# Patient Record
Sex: Female | Born: 1980 | Hispanic: No | Marital: Married | Smoking: Current every day smoker
Health system: Southern US, Community
[De-identification: ages and names within clinical notes are randomized; demographics above are authoritative.]

---

## 2010-11-24 ENCOUNTER — Emergency Department (HOSPITAL_COMMUNITY)
Admission: EM | Admit: 2010-11-24 | Discharge: 2010-11-24 | Disposition: A | Discharge status: Home / Self Care | Payer: Medicaid Other | Attending: Emergency Medicine | Admitting: Emergency Medicine

## 2010-11-24 DIAGNOSIS — R030 Elevated blood-pressure reading, without diagnosis of hypertension: Secondary | ICD-10-CM | POA: Insufficient documentation

## 2010-11-24 DIAGNOSIS — L089 Local infection of the skin and subcutaneous tissue, unspecified: Secondary | ICD-10-CM | POA: Insufficient documentation

## 2013-12-05 ENCOUNTER — Encounter: Payer: Medicaid Other | Admitting: Family

## 2017-11-01 ENCOUNTER — Other Ambulatory Visit: Payer: Self-pay

## 2017-11-01 ENCOUNTER — Encounter (HOSPITAL_COMMUNITY): Payer: Self-pay | Admitting: Emergency Medicine

## 2017-11-01 ENCOUNTER — Emergency Department (HOSPITAL_COMMUNITY)
Admission: EM | Admit: 2017-11-01 | Discharge: 2017-11-02 | Disposition: A | Discharge status: Home / Self Care | Payer: Self-pay | Attending: Emergency Medicine | Admitting: Emergency Medicine

## 2017-11-01 ENCOUNTER — Emergency Department (HOSPITAL_COMMUNITY): Payer: Self-pay

## 2017-11-01 DIAGNOSIS — Y9301 Activity, walking, marching and hiking: Secondary | ICD-10-CM | POA: Insufficient documentation

## 2017-11-01 DIAGNOSIS — X58XXXA Exposure to other specified factors, initial encounter: Secondary | ICD-10-CM | POA: Insufficient documentation

## 2017-11-01 DIAGNOSIS — Y999 Unspecified external cause status: Secondary | ICD-10-CM | POA: Insufficient documentation

## 2017-11-01 DIAGNOSIS — Y929 Unspecified place or not applicable: Secondary | ICD-10-CM | POA: Insufficient documentation

## 2017-11-01 DIAGNOSIS — F17200 Nicotine dependence, unspecified, uncomplicated: Secondary | ICD-10-CM | POA: Insufficient documentation

## 2017-11-01 DIAGNOSIS — M25531 Pain in right wrist: Secondary | ICD-10-CM | POA: Insufficient documentation

## 2017-11-01 NOTE — ED Triage Notes (Addendum)
Pt reports feeling a pain in her hand about 1.5 weeks ago when pushing a cart, reports she heard a "pop". Pt reports having pain when lifting or putting pressure on her hand. She denies recent injury.

## 2017-11-01 NOTE — ED Notes (Signed)
Patient transported to X-ray 

## 2017-11-01 NOTE — ED Provider Notes (Signed)
[facility]   CSN: [id number] Arrival date & time: 11/01/17  2242     History   Chief Complaint Chief Complaint  Patient presents with  . Hand Pain    HPI Aimee Schmidt is a 37 y.o. female with a hx of tobacco abuse who presents to the ED with complaints of R hand pain s/p possible injury 1.5 weeks ago. Patient states that she was at work and went to push a cart with the palm of her hand with her wrist in extension and she felt a popping sensation to the radial aspect of the wrist/hand. States she has had discomfort since this incident, mostly with movements such as extension and with applying pressure to the palm of the hand, if at rest there is not much pain. Tonight she lifted a child and felt the pain to the proximal portion of the pain radiating to her mid forearm on that side prompting ER visit. Denies numbness, weakness, tingling, fever, or chills. Patient is R hand dominant. Patient denies chance of pregnancy.   HPI  History reviewed. No pertinent past medical history.  There are no active problems to display for this patient.   Past Surgical History:  Procedure Laterality Date  . CESAREAN SECTION       OB History   None      Home Medications    Prior to Admission medications   Not on File    Family History History reviewed. No pertinent family history.  Social History Social History   Tobacco Use  . Smoking status: Current Every Day Smoker    Packs/day: 0.20  . Smokeless tobacco: Never Used  Substance Use Topics  . Alcohol use: Not on file    Comment: occasionally  . Drug use: Not on file     Allergies   Latex   Review of Systems Review of Systems  Constitutional: Negative for chills and fever.  Musculoskeletal: Positive for arthralgias.  Skin: Negative for color change and wound.  Neurological: Negative for weakness and numbness.   Physical Exam Updated Vital Signs BP 111/72   Pulse 94    Temp 98.7 F (37.1 C) (Oral)   Resp 17   Ht 5\' 5"  (1.651 m)   Wt 83.9 kg   LMP 10/11/2017   SpO2 100%   BMI 30.79 kg/m   Physical Exam  Constitutional: She appears well-developed and well-nourished. No distress.  HENT:  Head: Normocephalic and atraumatic.  Eyes: Conjunctivae are normal. Right eye exhibits no discharge. Left eye exhibits no discharge.  Cardiovascular:  Pulses:      Radial pulses are 2+ on the right side, and 2+ on the left side.  Musculoskeletal:  Upper extremity: No obvious deformity, appreciable swelling, erythema, ecchymosis, warmth, or open wounds. Patient has full AROM to bilateral elbows, wrists, and all digits including IP and MCP joints. She is tender to palpation over the R 1st MCP, 1st metatarsal and radial aspect of the distal forearm/wrist. There is no snuffbox tenderness. She is NVI distally. Negative phalens. Negative median nerve or ulnar nerve tinels. + Finkelstein to RUE.   Neurological: She is alert.  Clear speech. 5/5 symmetric grip strength. Sensation grossly intact to bilateral upper extremities. Able to perform OK sign, thumbs up, and cross 2nd/3rd digits bilaterally.   Skin: Capillary refill takes less than 2 seconds.  Psychiatric: She has a normal mood and affect. Her behavior is normal. Thought content normal.  Nursing note and vitals  reviewed.   ED Treatments / Results  Labs (all labs ordered are listed, but only abnormal results are displayed) Labs Reviewed - No data to display  EKG None  Radiology No results found.  Procedures Procedures (including critical care time) .Splint Application Date/Time: 11/02/2017 12:45AM  Performed by: EDT Authorized by: Cherly Anderson, PA-C    Consent:    Consent obtained:  Verbal   Consent given by:  Patient   Risks discussed:  Discoloration, numbness, pain and swelling   Alternatives discussed:  No treatment Pre-procedure details:    Sensation:  Normal Procedure details:     Laterality:  Right   Location:  Wrist   Wrist:  R wrist   Splint type:  Thumb spica (removal brace) Post-procedure details:    Sensation:  Normal   Patient tolerance of procedure:  Tolerated well, no immediate complications  Medications Ordered in ED Medications  naproxen (NAPROSYN) tablet 500 mg (500 mg Oral Given 11/02/17 0108)     Initial Impression / Assessment and Plan / ED Course  I have reviewed the triage vital signs and the nursing notes.  Pertinent labs & imaging results that were available during my care of the patient were reviewed by me and considered in my medical decision making (see chart for details).   Patient presents to the ED with R hand pain. Patient nontoxic appearing, resting comfortably. No fevers or overlying erythema/warmth to raise concern for infectious etiology.  X-ray negative for fracture/dislocation. NVI distally. Based on H&P query DeQuervain's tenosynovitis, will place in thumb spica with prescription for naproxen and hand/PCP follow up. I discussed results, treatment plan, need for follow-up, and return precautions with the patient. Provided opportunity for questions, patient confirmed understanding and is in agreement with plan.    Final Clinical Impressions(s) / ED Diagnoses   Final diagnoses:  Right wrist pain    ED Discharge Orders         Ordered    naproxen (NAPROSYN) 500 MG tablet  2 times daily     11/02/17 0100           Petrucelli, Pleas Koch, PA-C 11/02/17 0119    Azalia Bilis, MD 11/02/17 [phone]

## 2017-11-02 ENCOUNTER — Encounter (HOSPITAL_COMMUNITY): Payer: Self-pay | Admitting: Student

## 2017-11-02 MED ORDER — NAPROXEN 250 MG PO TABS
500.0000 mg | ORAL_TABLET | Freq: Once | ORAL | Status: AC
  Administered 2017-11-02: 500 mg via ORAL
  Filled 2017-11-02: qty 2

## 2017-11-02 MED ORDER — NAPROXEN 500 MG PO TABS: 500.0000 mg | ORAL_TABLET | Freq: Two times a day (BID) | ORAL | 0 refills | Status: AC

## 2017-11-02 NOTE — ED Notes (Signed)
Pt placed in right wrist splint, pulse, function, and feeling maintained after placement.

## 2017-11-02 NOTE — Discharge Instructions (Addendum)
Please read and follow all provided instructions.  You have been seen today for pain to your right hand/wrist.   Tests performed today include: An x-ray of the affected area - does NOT show any broken bones or dislocations.  Vital signs. See below for your results today.   We suspect your pain may be due to a problem with the tendon in your wrist/hand. We are providing a brace to wear for comfort and stabilization.   Home care instructions:  Protect (with brace, splint, sling), if given by your provider Rest  Medications:  - Naproxen is a nonsteroidal anti-inflammatory medication that will help with pain and swelling. Be sure to take this medication as prescribed with food, 1 pill every 12 hours,  It should be taken with food, as it can cause stomach upset, and more seriously, stomach bleeding. Do not take other nonsteroidal anti-inflammatory medications with this such as Advil, Motrin, Aleve, Mobic, Goodie Powder, or Motrin.    You make take Tylenol per over the counter dosing with these medications.   We have prescribed you new medication(s) today. Discuss the medications prescribed today with your pharmacist as they can have adverse effects and interactions with your other medicines including over the counter and prescribed medications. Seek medical evaluation if you start to experience new or abnormal symptoms after taking one of these medicines, seek care immediately if you start to experience difficulty breathing, feeling of your throat closing, facial swelling, or rash as these could be indications of a more serious allergic reaction  Follow-up instructions: Please follow-up with your primary care provider or the provided orthopedic physician (bone specialist) if you continue to have significant pain in 1 week. In this case you may have a more severe injury that requires further care.   Return instructions:  Please return if your digits or extremity are numb or tingling, appear gray  or blue, or you have severe pain (also elevate the extremity and loosen splint or wrap if you were given one) Please return if you have redness or fevers.  Please return to the Emergency Department if you experience worsening symptoms.  Please return if you have any other emergent concerns. Additional Information:  Your vital signs today were: BP 111/72    Pulse 94    Temp 98.7 F (37.1 C) (Oral)    Resp 17    Ht 5\' 5"  (1.651 m)    Wt 83.9 kg    LMP 10/11/2017    SpO2 100%    BMI 30.79 kg/m  If your blood pressure (BP) was elevated above 135/85 this visit, please have this repeated by your doctor within one month. ---------------

## 2019-09-15 IMAGING — DX DG HAND COMPLETE 3+V*R*
3 series · 3 of 3 positions shown · non-contrast
Comparison: None.

CLINICAL DATA: Pain in the hand for 1.5 weeks while pushing a car.

EXAM:
RIGHT HAND - COMPLETE 3+ VIEW

[hand pa]
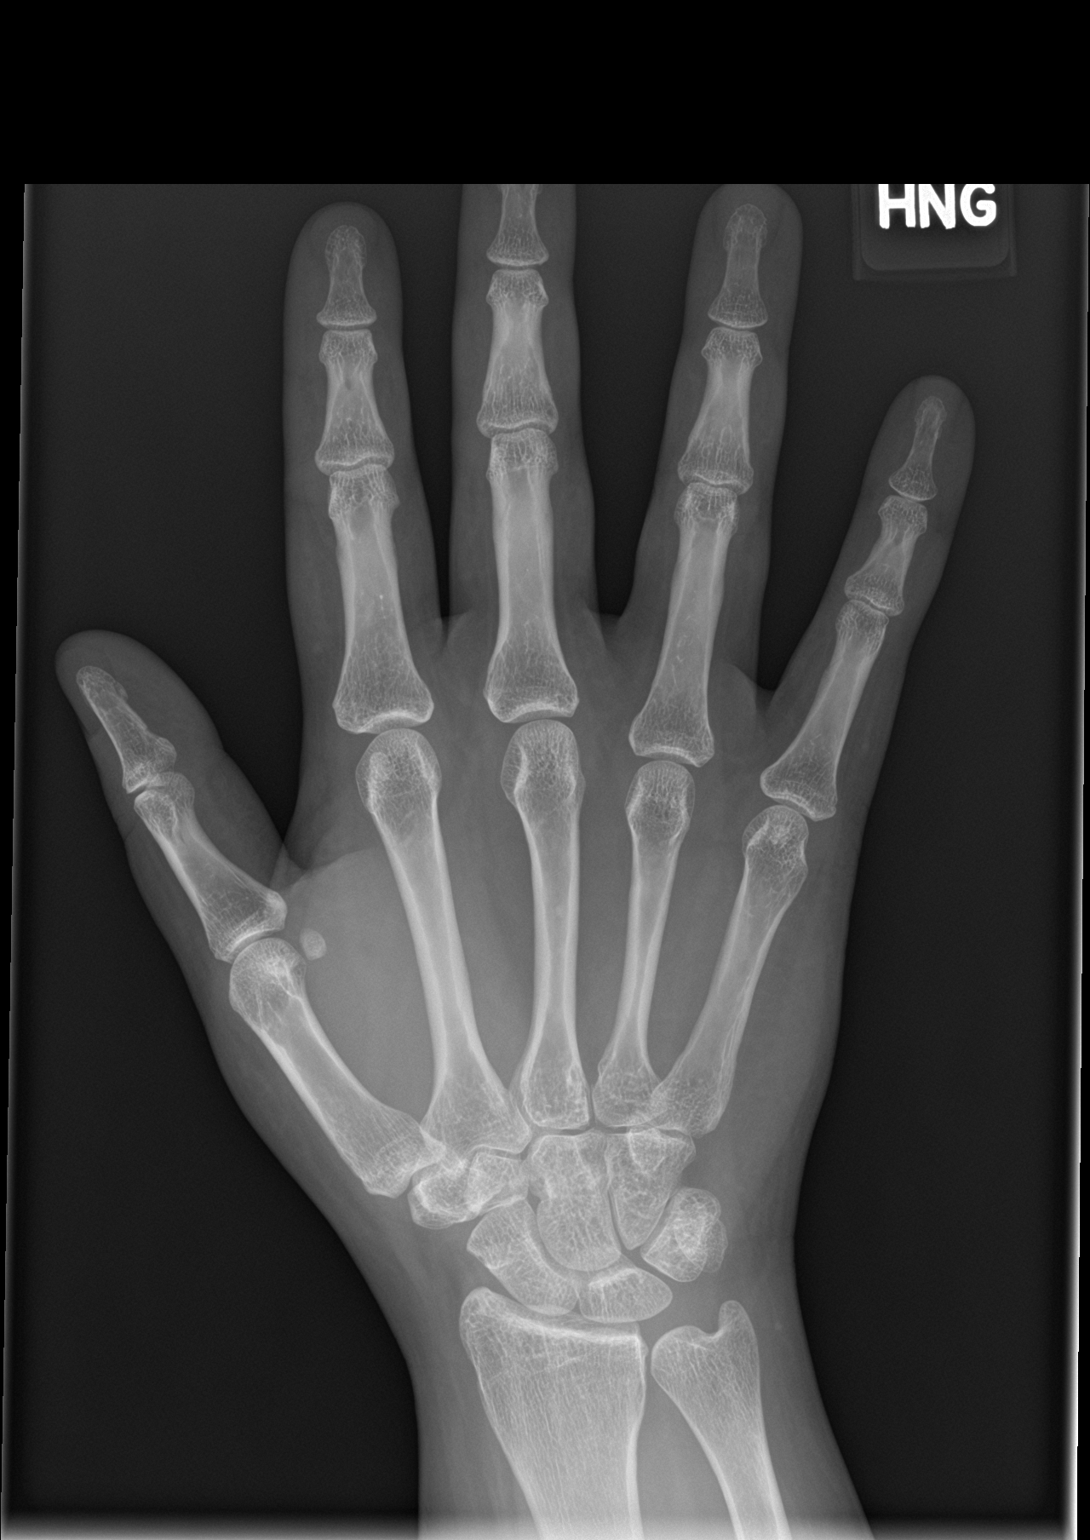

[hand obl]
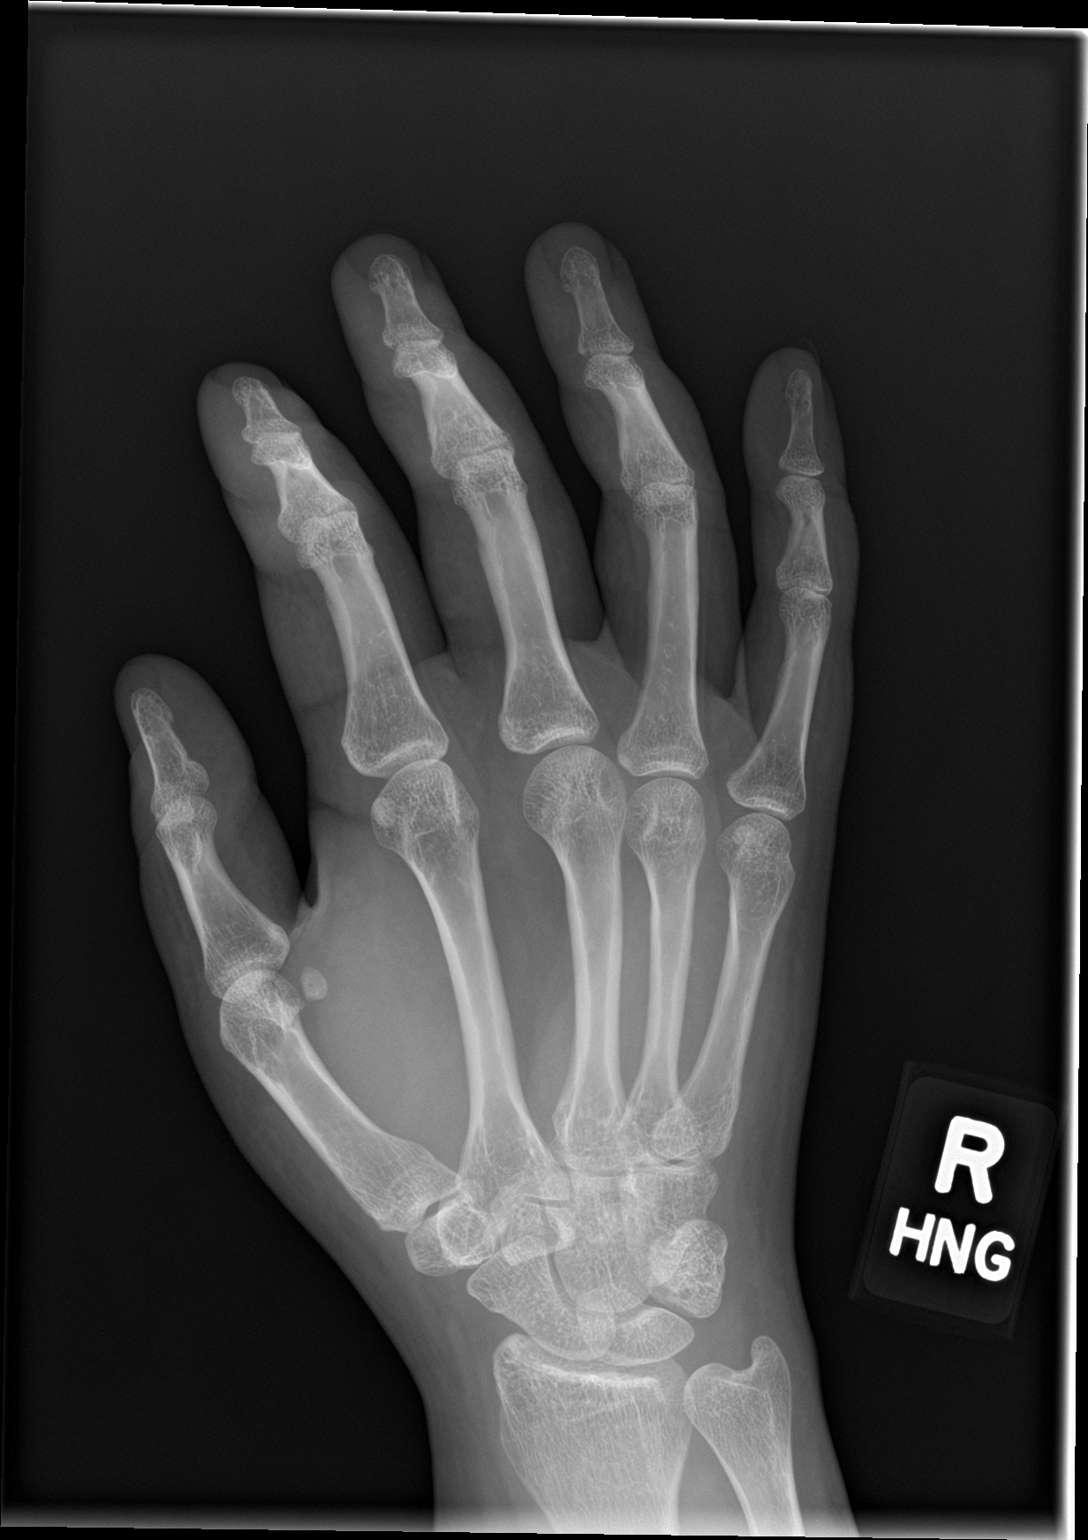

[hand lat]
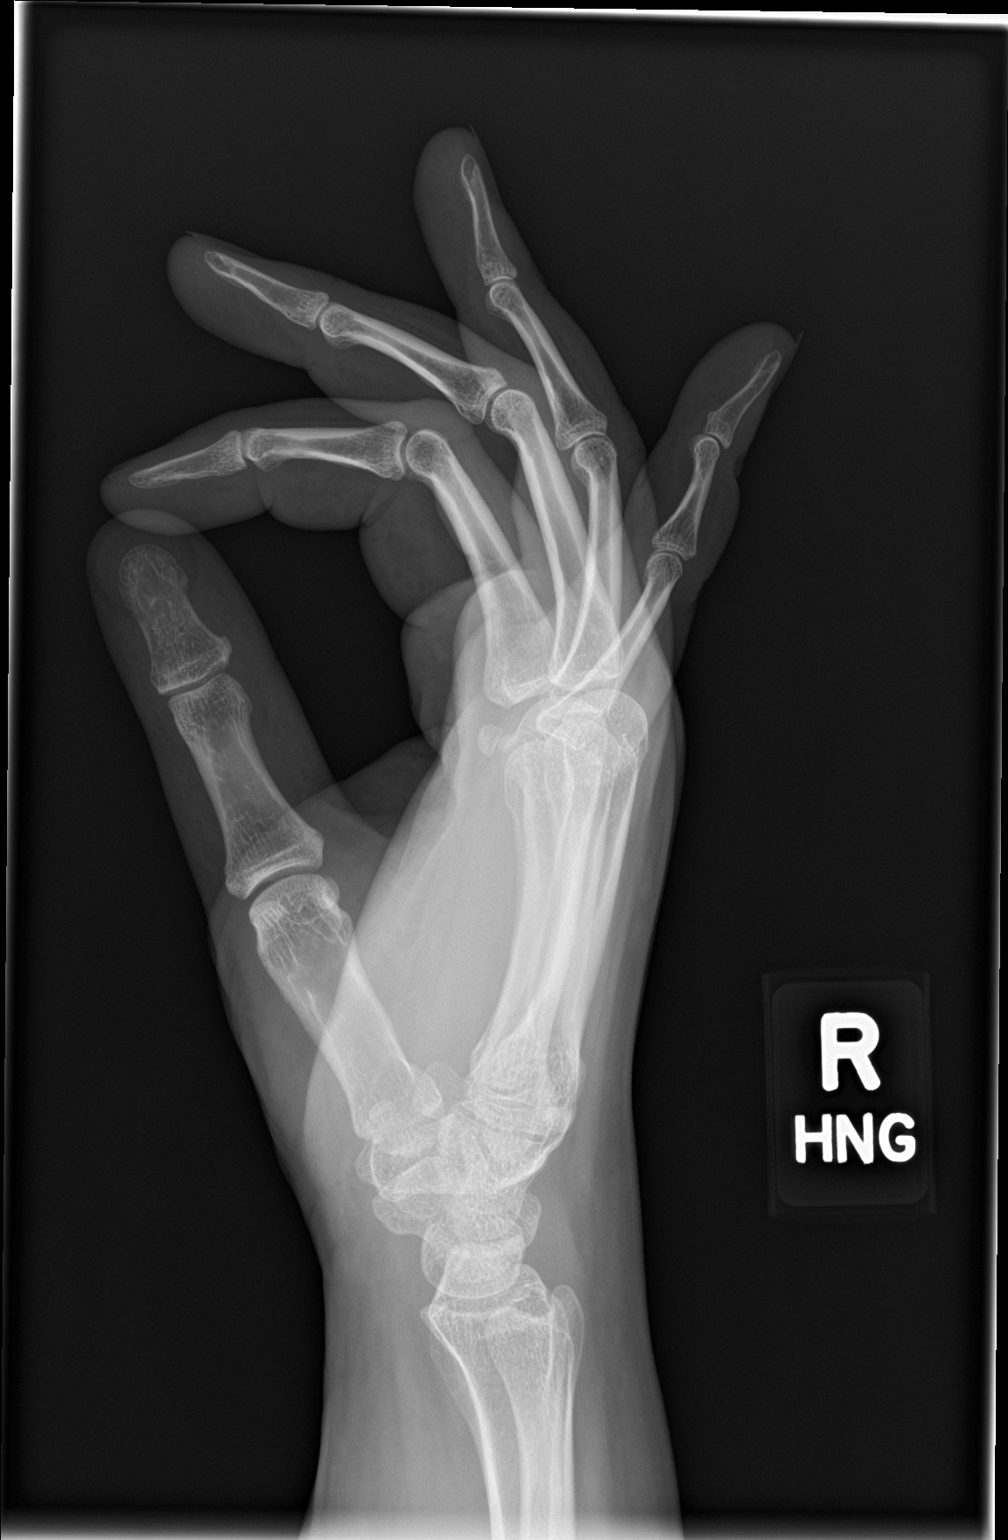

[3 of 3 positions shown; findings below may reference images not displayed]

FINDINGS: There is no evidence of fracture or dislocation. There is no
evidence of arthropathy or other focal bone abnormality. Soft
tissues are unremarkable.
IMPRESSION: No acute osseous abnormality.
